# Patient Record
Sex: Male | Born: 1995 | Race: White | Hispanic: No | Marital: Single | Smoking: Current every day smoker
Health system: Southern US, Community
[De-identification: ages and names within clinical notes are randomized; demographics above are authoritative.]

## PROBLEM LIST (undated history)

## (undated) HISTORY — PX: TONSILLECTOMY: SUR1361

---
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
## 2017-08-25 ENCOUNTER — Encounter (HOSPITAL_COMMUNITY): Payer: Self-pay | Admitting: Emergency Medicine

## 2017-08-25 ENCOUNTER — Ambulatory Visit (HOSPITAL_COMMUNITY)
Admission: EM | Admit: 2017-08-25 | Discharge: 2017-08-25 | Disposition: A | Discharge status: Home / Self Care | Payer: Self-pay | Attending: Emergency Medicine | Admitting: Emergency Medicine

## 2017-08-25 DIAGNOSIS — F17210 Nicotine dependence, cigarettes, uncomplicated: Secondary | ICD-10-CM | POA: Insufficient documentation

## 2017-08-25 DIAGNOSIS — J029 Acute pharyngitis, unspecified: Secondary | ICD-10-CM | POA: Insufficient documentation

## 2017-08-25 LAB — POCT RAPID STREP A: STREPTOCOCCUS, GROUP A SCREEN (DIRECT): NEGATIVE

## 2017-08-25 MED ORDER — IBUPROFEN 600 MG PO TABS: 600.0000 mg | ORAL_TABLET | Freq: Four times a day (QID) | ORAL | 0 refills | Status: DC | PRN

## 2017-08-25 MED ORDER — FLUTICASONE PROPIONATE 50 MCG/ACT NA SUSP: 2.0000 | Freq: Every day | NASAL | 0 refills | Status: DC

## 2017-08-25 NOTE — ED Provider Notes (Signed)
HPI  SUBJECTIVE:  James Castaneda is a 22 y.o. male who presents with 3 days of sore throat, postnasal drip, sinus pain and pressure.  Report body aches, cough, raspy voice.  He denies rhinorrhea, fevers.  States that his allergies are bothering him.  Denies headache, abdominal pain, rash, difficulty breathing, neck stiffness, trismus, drooling, GERD symptoms.  He was on antibiotics 3 weeks ago for cellulitis.  He tried ibuprofen 800 mg without improvement of symptoms.  Symptoms better with hot liquids, worse with swallowing.  No antipyretic in the past 6 to 8 hours.  No contacts with strep or URI symptoms.  He has a past medical history of recurrent strep and is status post tonsillectomy.  No history of GERD, diabetes, mono.  PMD: Kathryne Sharper.   History reviewed. No pertinent past medical history.  Past Surgical History:  Procedure Laterality Date  . LAPAROSCOPIC APPENDECTOMY N/A 03/04/2016   Procedure: APPENDECTOMY LAPAROSCOPIC;  Surgeon: Darnell Level, MD;  Location: [facility];  Service: General;  Laterality: N/A;    History reviewed. No pertinent family history.  Social History   Tobacco Use  . Smoking status: Current Every Day Smoker    Packs/day: 0.50    Types: Cigarettes  . Smokeless tobacco: Never Used  Substance Use Topics  . Alcohol use: No  . Drug use: No    No current facility-administered medications for this encounter.   Current Outpatient Medications:  .  fluticasone (FLONASE) 50 MCG/ACT nasal spray, Place 2 sprays into both nostrils daily., Disp: 16 g, Rfl: 0 .  ibuprofen (ADVIL,MOTRIN) 600 MG tablet, Take 1 tablet (600 mg total) by mouth every 6 (six) hours as needed., Disp: 30 tablet, Rfl: 0  No Known Allergies   ROS  As noted in HPI.   Physical Exam  BP 121/65 (BP Location: Right Arm)   Pulse 60   Temp 98.2 F (36.8 C) (Oral)   Resp 18   SpO2 100%   Constitutional: Well developed, well nourished, no acute distress Eyes:  EOMI, conjunctiva normal  bilaterally HENT: Normocephalic, atraumatic,mucus membranes moist.  Positive mucoid nasal congestion.  Mild maxillary sinus tenderness.  Tonsils surgically absent.  Positive cobblestoning with postnasal drip. Neck: No cervical lymphadenopathy Respiratory: Normal inspiratory effort Cardiovascular: Normal rate no murmurs GI: nondistended, no splenomegaly skin: No rash, skin intact Musculoskeletal: no deformities Neurologic: Alert & oriented x 3, no focal neuro deficits Psychiatric: Speech and behavior appropriate   ED Course   Medications - No data to display  Orders Placed This Encounter  Procedures  . Culture, group A strep    Standing Status:   Standing    Number of Occurrences:   1  . POCT rapid strep A [facility])    Standing Status:   Standing    Number of Occurrences:   1    Results for orders placed or performed during the hospital encounter of 08/25/17 (from the past 24 hour(s))  POCT rapid strep A [facility])     Status: None   Collection Time: 08/25/17 10:48 AM  Result Value Ref Range   Streptococcus, Group A Screen (Direct) NEGATIVE NEGATIVE   No results found.  ED Clinical Impression  Sore throat   ED Assessment/Plan  Rapid strep negative.  Suspect this is coming from his allergies since he states that they are bothering him.  Home with Flonase, saline nasal irrigation, Benadryl/Maalox mixture, and antihistamine/decongestant combination of his choice such as Claritin-D, Allegra-D, Zyrtec-D, ibuprofen 600 mg to take  with 1 g of Tylenol 3 or 4 times a day.  There are no clear indications for antibiotics at this time.  Follow-up with his PMD as needed.  Discussed labs,  MDM, treatment plan, and plan for follow-up with patient.. patient agrees with plan.   Meds ordered this encounter  Medications  . fluticasone (FLONASE) 50 MCG/ACT nasal spray    Sig: Place 2 sprays into both nostrils daily.    Dispense:  16 g    Refill:  0  . ibuprofen  (ADVIL,MOTRIN) 600 MG tablet    Sig: Take 1 tablet (600 mg total) by mouth every 6 (six) hours as needed.    Dispense:  30 tablet    Refill:  0    *This clinic note was created usiScientist, clinical (histocompatibility and immunogenetics)ftware. Therefore, there may be occasional mistakes despite careful proofreading.   ?    Domenick Gong, MD 08/25/17 1256

## 2017-08-25 NOTE — ED Triage Notes (Signed)
Pt here for URI sx and sore throat 

## 2017-08-25 NOTE — Discharge Instructions (Addendum)
your rapid strep was negative today, so we have sent off a throat culture.  We will contact you and call in the appropriate antibiotics if your culture comes back positive for an infection requiring antibiotic treatment.  Give [place] a working phone number.   1 gram of Tylenol and 600 mg ibuprofen together 3-4 times a day as needed for pain.  Make sure you drink plenty of extra fluids.  Some people find salt water gargles and  Traditional Medicinal's "Throat Coat" tea helpful. Take 5 mL of liquid Benadryl and 5 mL of Maalox. Mix it together, and then hold it in your mouth for as long as you can and then swallow. You may do this 4 times a day.    Home with Flonase, saline nasal irrigation with distilled water and a Lloyd Huger med sinus rinse, and antihistamine/decongestant combination of your choice such as Claritin-D, Allegra-D, Zyrtec-D.  Go to www.goodrx.com to look up your medications. This will give you a list of where you can find your prescriptions at the most affordable prices. Or ask the pharmacist what the cash price is, or if they have any other discount programs available to help make your medication more affordable. This can be less expensive than what you would pay with insurance.

## 2017-08-27 LAB — CULTURE, GROUP A STREP (THRC)

## 2017-09-07 ENCOUNTER — Ambulatory Visit (HOSPITAL_COMMUNITY)
Admission: EM | Admit: 2017-09-07 | Discharge: 2017-09-07 | Disposition: A | Discharge status: Home / Self Care | Payer: Self-pay | Attending: Family Medicine | Admitting: Family Medicine

## 2017-09-07 ENCOUNTER — Encounter (HOSPITAL_COMMUNITY): Payer: Self-pay | Admitting: Emergency Medicine

## 2017-09-07 ENCOUNTER — Ambulatory Visit (INDEPENDENT_AMBULATORY_CARE_PROVIDER_SITE_OTHER): Payer: Self-pay

## 2017-09-07 ENCOUNTER — Other Ambulatory Visit: Payer: Self-pay

## 2017-09-07 DIAGNOSIS — S51851A Open bite of right forearm, initial encounter: Secondary | ICD-10-CM

## 2017-09-07 DIAGNOSIS — W540XXA Bitten by dog, initial encounter: Secondary | ICD-10-CM

## 2017-09-07 DIAGNOSIS — M79644 Pain in right finger(s): Secondary | ICD-10-CM

## 2017-09-07 DIAGNOSIS — Z23 Encounter for immunization: Secondary | ICD-10-CM

## 2017-09-07 MED ORDER — TETANUS-DIPHTH-ACELL PERTUSSIS 5-2.5-18.5 LF-MCG/0.5 IM SUSP
INTRAMUSCULAR | Status: AC
  Filled 2017-09-07: qty 0.5

## 2017-09-07 MED ORDER — AMOXICILLIN-POT CLAVULANATE 875-125 MG PO TABS: 1.0000 | ORAL_TABLET | Freq: Two times a day (BID) | ORAL | 0 refills | Status: AC

## 2017-09-07 MED ORDER — TETANUS-DIPHTH-ACELL PERTUSSIS 5-2.5-18.5 LF-MCG/0.5 IM SUSP
0.5000 mL | Freq: Once | INTRAMUSCULAR | Status: AC
  Administered 2017-09-07: 0.5 mL via INTRAMUSCULAR

## 2017-09-07 NOTE — Discharge Instructions (Addendum)
Your Xray is normal today. Cleanse wound with soap and water daily, keep covered to keep clean.  Complete course of antibiotics.   Ice, elevation, ibuprofen for pain control, may have some bruising noted  If develop increased pain, swelling, redness, drainage please return to be seen.

## 2017-09-07 NOTE — ED Triage Notes (Addendum)
The patient presented to the [facility] with a complaint of a dog bite to his right forearm that occurred today while working at the [employer]. The patient reported that the animal is current on its rabies vaccinations. The patient's wound was wrapped with gauze upon arrival at triage.

## 2017-09-07 NOTE — ED Provider Notes (Signed)
[facility]    CSN: [id number] Arrival date & time: 09/07/17  1023     History   Chief Complaint Chief Complaint  Patient presents with  . Animal Bite    HPI James Castaneda is a 22 y.o. male.   James Castaneda presents with complaints of dog bite to right forearm which occurred at approximately 10am today. He was working at the [employer] for [employer]. Two dogs started fighting and he tried to separate them, causing a dog to pull back and bite his arm. He had bleeding. Wounds were cleansed and wrapped prior to arrival. The dog is known to but updated on rabies vaccine. States has pain with movement to his middle finger. Without numbness or tingling to hand or fingers. Unknown last tetanus. Without contributing medical history.    ROS per HPI.       History reviewed. No pertinent past medical history.  Patient Active Problem List   Diagnosis Date Noted  . Appendicitis, acute 03/04/2016  . Acute appendicitis 03/04/2016    Past Surgical History:  Procedure Laterality Date  . LAPAROSCOPIC APPENDECTOMY N/A 03/04/2016   Procedure: APPENDECTOMY LAPAROSCOPIC;  Surgeon: Darnell Level, MD;  Location: [facility];  Service: General;  Laterality: N/A;  . TONSILLECTOMY         Home Medications    Prior to Admission medications   Medication Sig Start Date End Date Taking? Authorizing Provider  amoxicillin-clavulanate (AUGMENTIN) 875-125 MG tablet Take 1 tablet by mouth every 12 (twelve) hours for 10 days. 09/07/17 09/17/17  Georgetta Haber, NP    Family History History reviewed. No pertinent family history.  Social History Social History   Tobacco Use  . Smoking status: Current Every Day Smoker    Packs/day: 0.50    Types: Cigarettes  . Smokeless tobacco: Never Used  Substance Use Topics  . Alcohol use: No  . Drug use: No     Allergies   Patient has no known allergies.   Review of Systems Review of Systems   Physical Exam Triage Vital Signs ED  Triage Vitals [09/07/17 1100]  Enc Vitals Group     BP 130/78     Pulse Rate 80     Resp 18     Temp 98.5 F (36.9 C)     Temp Source Oral     SpO2 100 %     Weight      Height      Head Circumference      Peak Flow      Pain Score 10     Pain Loc      Pain Edu?      Excl. in GC?    No data found.  Updated Vital Signs BP 130/78 (BP Location: Left Arm)   Pulse 80   Temp 98.5 F (36.9 C) (Oral)   Resp 18   SpO2 100%   Visual Acuity Right Eye Distance:   Left Eye Distance:   Bilateral Distance:    Right Eye Near:   Left Eye Near:    Bilateral Near:     Physical Exam  Constitutional: He is oriented to person, place, and time. He appears well-developed and well-nourished.  Cardiovascular: Normal rate and regular rhythm.  Pulmonary/Chest: Effort normal and breath sounds normal.  Musculoskeletal:       Right forearm: He exhibits tenderness and swelling. He exhibits no edema and no deformity.  Approximately 5 puncture wounds to right forearm, 1 with active bleeding once dressing  removed; to dorsal and ventral forearm; mild tenderness and swelling to dorsal forearm puncture; strong radial pulse; full sensation and ROM to fingers noted; full ROM to wrist and elbow  Neurological: He is alert and oriented to person, place, and time.  Skin: Skin is warm and dry.     UC Treatments / Results  Labs (all labs ordered are listed, but only abnormal results are displayed) Labs Reviewed - No data to display  EKG None  Radiology No results found.  Procedures Procedures (including critical care time)  Medications Ordered in UC Medications  Tdap (BOOSTRIX) injection 0.5 mL (has no administration in time range)    Initial Impression / Assessment and Plan / UC Course  I have reviewed the triage vital signs and the nursing notes.  Pertinent labs & imaging results that were available during my care of the patient were reviewed by me and considered in my medical decision  making (see chart for details).     tdap updated. Xray obtained to evaluate for fb and underlying structure involvement. Xray withotu acute findings. Wounds cleansed and dressed. augmentin initiated at this time for prophylaxis. Dog is up to date with rabies vac. Return precautions provided. Patient verbalized understanding and agreeable to plan.     Final Clinical Impressions(s) / UC Diagnoses   Final diagnoses:  Dog bite, initial encounter     Discharge Instructions     Cleanse wound with soap and water daily, keep covered to keep clean.  Complete course of antibiotics.   Ice, elevation, ibuprofen for pain control, may have some bruising noted  If develop increased pain, swelling, redness, drainage please return to be seen.    ED Prescriptions    Medication Sig Dispense Auth. Provider   amoxicillin-clavulanate (AUGMENTIN) 875-125 MG tablet Take 1 tablet by mouth every 12 (twelve) hours for 10 days. 20 tablet Georgetta Haber, NP     Controlled Substance Prescriptions [place] Controlled Substance Registry consulted? Not Applicable   Georgetta Haber, NP 09/07/17 1209

## 2017-09-09 ENCOUNTER — Other Ambulatory Visit: Payer: Self-pay

## 2017-09-09 ENCOUNTER — Encounter (HOSPITAL_COMMUNITY): Payer: Self-pay | Admitting: Emergency Medicine

## 2017-09-09 ENCOUNTER — Ambulatory Visit (HOSPITAL_COMMUNITY)
Admission: EM | Admit: 2017-09-09 | Discharge: 2017-09-09 | Disposition: A | Discharge status: Home / Self Care | Payer: Self-pay | Attending: Family Medicine | Admitting: Family Medicine

## 2017-09-09 ENCOUNTER — Ambulatory Visit (INDEPENDENT_AMBULATORY_CARE_PROVIDER_SITE_OTHER): Payer: Self-pay

## 2017-09-09 DIAGNOSIS — M79641 Pain in right hand: Secondary | ICD-10-CM

## 2017-09-09 DIAGNOSIS — W540XXD Bitten by dog, subsequent encounter: Secondary | ICD-10-CM

## 2017-09-09 DIAGNOSIS — S51851D Open bite of right forearm, subsequent encounter: Secondary | ICD-10-CM

## 2017-09-09 MED ORDER — IBUPROFEN 800 MG PO TABS: 800.0000 mg | ORAL_TABLET | Freq: Three times a day (TID) | ORAL | 0 refills | Status: AC

## 2017-09-09 NOTE — ED Provider Notes (Signed)
[facility]    CSN: [id number] Arrival date & time: 09/09/17  1337     History   Chief Complaint Chief Complaint  Patient presents with  . Hand Pain    HPI James Castaneda is a 22 y.o. male no extremity past medical history presenting today for evaluation of right hand pain.  Patient was bitten by a dog at the animal shelter will working as part of the [employer].  This happened on Monday, patient was seen and evaluated here on the same day, forearm x-rays were obtained which were negative.  Patient was started on Augmentin which he began yesterday.  He has had 3 doses of this.  He presents again today as he has felt weakness with grip strength as well as some mild numbness and tingling in his second and fifth fingers.  HPI  History reviewed. No pertinent past medical history.  Patient Active Problem List   Diagnosis Date Noted  . Appendicitis, acute 03/04/2016  . Acute appendicitis 03/04/2016    Past Surgical History:  Procedure Laterality Date  . LAPAROSCOPIC APPENDECTOMY N/A 03/04/2016   Procedure: APPENDECTOMY LAPAROSCOPIC;  Surgeon: Darnell Level, MD;  Location: [facility];  Service: General;  Laterality: N/A;  . TONSILLECTOMY         Home Medications    Prior to Admission medications   Medication Sig Start Date End Date Taking? Authorizing Provider  amoxicillin-clavulanate (AUGMENTIN) 875-125 MG tablet Take 1 tablet by mouth every 12 (twelve) hours for 10 days. 09/07/17 09/17/17  Georgetta Haber, NP  ibuprofen (ADVIL,MOTRIN) 800 MG tablet Take 1 tablet (800 mg total) by mouth 3 (three) times daily. 09/09/17   Wieters, Junius Creamer, PA-C    Family History Family History  Problem Relation Age of Onset  . Hypertension Mother     Social History Social History   Tobacco Use  . Smoking status: Current Every Day Smoker    Packs/day: 0.50    Types: Cigarettes  . Smokeless tobacco: Never Used  Substance Use Topics  . Alcohol use: No  . Drug use: No      Allergies   Patient has no known allergies.   Review of Systems Review of Systems  Constitutional: Negative for fatigue and fever.  Eyes: Negative for redness, itching and visual disturbance.  Respiratory: Negative for shortness of breath.   Cardiovascular: Negative for chest pain and leg swelling.  Gastrointestinal: Negative for nausea and vomiting.  Musculoskeletal: Positive for arthralgias, joint swelling and myalgias.  Skin: Positive for color change and wound. Negative for rash.  Neurological: Negative for dizziness, syncope, weakness, light-headedness and headaches.     Physical Exam Triage Vital Signs ED Triage Vitals  Enc Vitals Group     BP 09/09/17 1405 122/70     Pulse Rate 09/09/17 1405 95     Resp 09/09/17 1405 18     Temp 09/09/17 1405 97.6 F (36.4 C)     Temp Source 09/09/17 1405 Oral     SpO2 09/09/17 1405 99 %     Weight --      Height --      Head Circumference --      Peak Flow --      Pain Score 09/09/17 1401 8     Pain Loc --      Pain Edu? --      Excl. in GC? --    No data found.  Updated Vital Signs BP 122/70 (BP Location: Left Arm)  Pulse 95   Temp 97.6 F (36.4 C) (Oral)   Resp 18   SpO2 99%   Visual Acuity Right Eye Distance:   Left Eye Distance:   Bilateral Distance:    Right Eye Near:   Left Eye Near:    Bilateral Near:     Physical Exam  Constitutional: He appears well-developed and well-nourished.  HENT:  Head: Normocephalic and atraumatic.  Eyes: Conjunctivae are normal.  Neck: Neck supple.  Cardiovascular: Normal rate and regular rhythm.  No murmur heard. Pulmonary/Chest: Effort normal and breath sounds normal. No respiratory distress.  Abdominal: Soft. There is no tenderness.  Musculoskeletal: He exhibits no edema.  Moderate swelling over forearm, patient with 4/5 grip strength on right side compared to left, able to fully abduct all fingers except for fifth, states that this is normal, patient able to  abduct fingers, 4/5 strength at wrist with flexion and extension compared to left.  Neurological: He is alert.  Skin: Skin is warm and dry.  Multiple puncture wounds to right forearm, appear to be healing well, no surrounding erythema  Psychiatric: He has a normal mood and affect.  Nursing note and vitals reviewed.    UC Treatments / Results  Labs (all labs ordered are listed, but only abnormal results are displayed) Labs Reviewed - No data to display  EKG None  Radiology No results found.  Procedures Procedures (including critical care time)  Medications Ordered in UC Medications - No data to display  Initial Impression / Assessment and Plan / UC Course  I have reviewed the triage vital signs and the nursing notes.  Pertinent labs & imaging results that were available during my care of the patient were reviewed by me and considered in my medical decision making (see chart for details).     X-ray negative.  Patient likely needs PT or OT to work on hand movement, will have follow-up with Ortho/hand.  Will provide ibuprofen for pain.  Continue to complete course of Augmentin. Discussed strict return precautions. Patient verbalized understanding and is agreeable with plan.  Final Clinical Impressions(s) / UC Diagnoses   Final diagnoses:  Dog bite, subsequent encounter  Right hand pain     Discharge Instructions     Use anti-inflammatories for pain/swelling. You may take up to 800 mg Ibuprofen every 8 hours with food. You may supplement Ibuprofen with Tylenol [phone] mg every 8 hours.   Work on regaining movement in hand  Follow up with orthopedics/hand for further evaluation of hand weakness.  You may need occupation therapy or physical therapy   ED Prescriptions    Medication Sig Dispense Auth. Provider   ibuprofen (ADVIL,MOTRIN) 800 MG tablet Take 1 tablet (800 mg total) by mouth 3 (three) times daily. 30 tablet Wieters, Irrigon C, PA-C     Controlled  Substance Prescriptions [place] Controlled Substance Registry consulted? Not Applicable   Lew Dawes, [place] 09/09/17 1442

## 2017-09-09 NOTE — ED Triage Notes (Addendum)
Patient works at [employer].  Reports he had a dog bite to right arm and was [facility] recently.   Reports having difficulty moving fingers as normal.  Says he cannot make a fist-with attempt feels sharp pain down middle finger and into hand and forearm.  Reports unable to maintain a grip with right hand  Patient is currently a resident at [facility] house

## 2017-09-09 NOTE — Discharge Instructions (Signed)
Use anti-inflammatories for pain/swelling. You may take up to 800 mg Ibuprofen every 8 hours with food. You may supplement Ibuprofen with Tylenol [phone] mg every 8 hours.   Work on regaining movement in hand  Follow up with orthopedics/hand for further evaluation of hand weakness.  You may need occupation therapy or physical therapy

## 2019-06-17 ENCOUNTER — Emergency Department (HOSPITAL_COMMUNITY)
Admission: EM | Admit: 2019-06-17 | Discharge: 2019-06-17 | Disposition: A | Discharge status: Home / Self Care | Payer: Self-pay | Attending: Emergency Medicine | Admitting: Emergency Medicine

## 2019-06-17 ENCOUNTER — Other Ambulatory Visit: Payer: Self-pay

## 2019-06-17 ENCOUNTER — Encounter (HOSPITAL_COMMUNITY): Payer: Self-pay

## 2019-06-17 DIAGNOSIS — F17210 Nicotine dependence, cigarettes, uncomplicated: Secondary | ICD-10-CM | POA: Insufficient documentation

## 2019-06-17 DIAGNOSIS — K029 Dental caries, unspecified: Secondary | ICD-10-CM | POA: Insufficient documentation

## 2019-06-17 DIAGNOSIS — Z79899 Other long term (current) drug therapy: Secondary | ICD-10-CM | POA: Insufficient documentation

## 2019-06-17 DIAGNOSIS — K0381 Cracked tooth: Secondary | ICD-10-CM | POA: Insufficient documentation

## 2019-06-17 DIAGNOSIS — K0889 Other specified disorders of teeth and supporting structures: Secondary | ICD-10-CM

## 2019-06-17 MED ORDER — PENICILLIN V POTASSIUM 500 MG PO TABS: 500.0000 mg | ORAL_TABLET | Freq: Four times a day (QID) | ORAL | 0 refills | Status: AC

## 2019-06-17 MED ORDER — IBUPROFEN 400 MG PO TABS
600.0000 mg | ORAL_TABLET | Freq: Once | ORAL | Status: AC
  Administered 2019-06-17: 11:00:00 600 mg via ORAL
  Filled 2019-06-17: qty 1

## 2019-06-17 NOTE — ED Triage Notes (Signed)
Pt c.o right upper tooth pain since 6am this morning. Chipped tooth noted. States he took tylenol this am

## 2019-06-17 NOTE — Discharge Instructions (Addendum)
You were given a prescription for antibiotics. Please take the antibiotic prescription fully.   You may alternate taking Tylenol and Ibuprofen as needed for pain control. You may take 400-600 mg of ibuprofen every 6 hours and [phone] mg of Tylenol every 6 hours. Do not exceed 4000 mg of Tylenol daily as this can lead to liver damage. Also, make sure to take Ibuprofen with meals as it can cause an upset stomach. Do not take other NSAIDs while taking Ibuprofen such as (Aleve, Naprosyn, Aspirin, Celebrex, etc) and do not take more than the prescribed dose as this can lead to ulcers and bleeding in your GI tract. You may use warm and cold compresses to help with your symptoms.   Please follow-up with a dentist in the next 5 to 7 days for reevaluation.  If you do not have a dentist, resources were provided for dentist in the area in your discharge summary.  Please contact one of the offices that are listed and make an appointment for follow-up.  Please return to the emergency department for any new or worsening symptoms.

## 2019-06-17 NOTE — ED Provider Notes (Signed)
[facility] EMERGENCY DEPARTMENT Provider Note   CSN: [id number] Arrival date & time: 06/17/19  1037     History Chief Complaint  Patient presents with  . Dental Pain    James Castaneda is a 24 y.o. male.  HPI   24 year old male with a history appendicitis, who presents to the emergency department today for evaluation of dental pain.  States he has right upper and right lower dental pain.  He has had pain in the past which is normally relieved by Tylenol however he try taking it this morning when he woke up but symptoms have not improved.  Denies associated fevers or facial swelling.  Denies other associated symptoms.  History reviewed. No pertinent past medical history.  Patient Active Problem List   Diagnosis Date Noted  . Appendicitis, acute 03/04/2016  . Acute appendicitis 03/04/2016    Past Surgical History:  Procedure Laterality Date  . LAPAROSCOPIC APPENDECTOMY N/A 03/04/2016   Procedure: APPENDECTOMY LAPAROSCOPIC;  Surgeon: Darnell Level, MD;  Location: [facility];  Service: General;  Laterality: N/A;  . TONSILLECTOMY         Family History  Problem Relation Age of Onset  . Hypertension Mother     Social History   Tobacco Use  . Smoking status: Current Every Day Smoker    Packs/day: 0.50    Types: Cigarettes  . Smokeless tobacco: Never Used  Substance Use Topics  . Alcohol use: No  . Drug use: No    Home Medications Prior to Admission medications   Medication Sig Start Date End Date Taking? Authorizing Provider  ibuprofen (ADVIL,MOTRIN) 800 MG tablet Take 1 tablet (800 mg total) by mouth 3 (three) times daily. 09/09/17   Wieters, Hallie C, PA-C  penicillin v potassium (VEETID) 500 MG tablet Take 1 tablet (500 mg total) by mouth 4 (four) times daily for 7 days. 06/17/19 06/24/19  James Brownlee S, PA-C    Allergies    Patient has no known allergies.  Review of Systems   Review of Systems  Constitutional: Negative for fever.  HENT:  Positive for dental problem. Negative for facial swelling.     Physical Exam Updated Vital Signs BP 136/87 (BP Location: Right Arm)   Pulse (!) 51   Temp 97.9 F (36.6 C) (Oral)   Resp 16   SpO2 100%   Physical Exam Constitutional:      General: He is not in acute distress.    Appearance: He is well-developed.  HENT:     Mouth/Throat:     Comments: Multiple dental caries, and missing teeth.  Tooth #3 is fractured and tender to percussion.  Tooth #31 is also tender to percussion.  No tenderness to the gumline or evidence of abscess.  No facial swelling or neck swelling.  No submandibular or sublingual swelling. Eyes:     Conjunctiva/sclera: Conjunctivae normal.  Cardiovascular:     Rate and Rhythm: Normal rate and regular rhythm.  Pulmonary:     Effort: Pulmonary effort is normal.     Breath sounds: Normal breath sounds.  Skin:    General: Skin is warm and dry.  Neurological:     Mental Status: He is alert and oriented to person, place, and time.     ED Results / Procedures / Treatments   Labs (all labs ordered are listed, but only abnormal results are displayed) Labs Reviewed - No data to display  EKG None  Radiology No results found.  Procedures Procedures (including critical care  time)  Medications Ordered in ED Medications - No data to display  ED Course  I have reviewed the triage vital signs and the nursing notes.  Pertinent labs & imaging results that were available during my care of the patient were reviewed by me and considered in my medical decision making (see chart for details).    MDM Rules/Calculators/A&P                      Patient with toothache.  No gross abscess.  Exam unconcerning for Ludwig'Castaneda angina or spread of infection.  Will treat with penicillin and pain medicine.  Urged patient to follow-up with dentist.     Final Clinical Impression(Castaneda) / ED Diagnoses Final diagnoses:  Pain, dental    Rx / DC Orders ED Discharge Orders          Ordered    penicillin v potassium (VEETID) 500 MG tablet  4 times daily     06/17/19 [address] James Lingenfelter S, PA-C 06/17/19 1055    Quintella Reichert, MD 06/17/19 1554

## 2020-07-12 ENCOUNTER — Encounter (HOSPITAL_COMMUNITY): Payer: Self-pay

## 2020-07-12 ENCOUNTER — Emergency Department (HOSPITAL_COMMUNITY): Payer: No Typology Code available for payment source

## 2020-07-12 ENCOUNTER — Emergency Department (HOSPITAL_COMMUNITY)
Admission: EM | Admit: 2020-07-12 | Discharge: 2020-07-12 | Disposition: A | Discharge status: Home / Self Care | Payer: No Typology Code available for payment source | Attending: Emergency Medicine | Admitting: Emergency Medicine

## 2020-07-12 DIAGNOSIS — R072 Precordial pain: Secondary | ICD-10-CM | POA: Insufficient documentation

## 2020-07-12 DIAGNOSIS — Y92410 Unspecified street and highway as the place of occurrence of the external cause: Secondary | ICD-10-CM | POA: Insufficient documentation

## 2020-07-12 DIAGNOSIS — M542 Cervicalgia: Secondary | ICD-10-CM | POA: Insufficient documentation

## 2020-07-12 DIAGNOSIS — Z5321 Procedure and treatment not carried out due to patient leaving prior to being seen by health care provider: Secondary | ICD-10-CM | POA: Diagnosis not present

## 2020-07-12 DIAGNOSIS — M25569 Pain in unspecified knee: Secondary | ICD-10-CM | POA: Insufficient documentation

## 2020-07-12 MED ORDER — IBUPROFEN 400 MG PO TABS
400.0000 mg | ORAL_TABLET | Freq: Once | ORAL | Status: DC
Start: 2020-07-12 — End: 2020-07-13

## 2020-07-12 MED ORDER — OXYCODONE-ACETAMINOPHEN 5-325 MG PO TABS
1.0000 | ORAL_TABLET | Freq: Once | ORAL | Status: AC
  Administered 2020-07-12: 1 via ORAL
  Filled 2020-07-12: qty 1
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
## 2020-07-12 NOTE — ED Notes (Signed)
Pt got up and used the urinal without distress or pain.

## 2020-10-22 ENCOUNTER — Encounter (HOSPITAL_BASED_OUTPATIENT_CLINIC_OR_DEPARTMENT_OTHER): Payer: Self-pay | Admitting: Emergency Medicine

## 2020-10-22 ENCOUNTER — Emergency Department (HOSPITAL_BASED_OUTPATIENT_CLINIC_OR_DEPARTMENT_OTHER)
Admission: EM | Admit: 2020-10-22 | Discharge: 2020-10-22 | Disposition: A | Discharge status: Home / Self Care | Payer: Self-pay | Attending: Emergency Medicine | Admitting: Emergency Medicine

## 2020-10-22 ENCOUNTER — Other Ambulatory Visit: Payer: Self-pay

## 2020-10-22 DIAGNOSIS — F17210 Nicotine dependence, cigarettes, uncomplicated: Secondary | ICD-10-CM | POA: Insufficient documentation

## 2020-10-22 DIAGNOSIS — R202 Paresthesia of skin: Secondary | ICD-10-CM | POA: Insufficient documentation

## 2020-10-22 DIAGNOSIS — M546 Pain in thoracic spine: Secondary | ICD-10-CM | POA: Insufficient documentation

## 2020-10-22 MED ORDER — DICLOFENAC SODIUM 1 % EX GEL: 2.0000 g | Freq: Four times a day (QID) | CUTANEOUS | 0 refills | Status: AC

## 2020-10-22 MED ORDER — METHOCARBAMOL 500 MG PO TABS: 500.0000 mg | ORAL_TABLET | Freq: Two times a day (BID) | ORAL | 0 refills | Status: AC | PRN
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
## 2020-10-22 NOTE — Discharge Instructions (Addendum)
I am prescribing you a strong muscle relaxer called Robaxin.  You can take this up to 2 times a day for management of your pain.  This medication can be sedating so do not mix it with alcohol.  Do not drive a car after taking it.  I am also prescribing you Voltaren gel.  This is an anti-inflammatory cream you can apply to the region of your back and shoulder that is hurting.  Please follow-up with Dr. Jordan Likes.  He has a sports medicine doctor that is located in the same building as this emergency department.  I would recommend following up with him to be reevaluated.  You can always come back to the emergency department if your symptoms worsen.  It was a pleasure to meet you.

## 2021-04-23 ENCOUNTER — Emergency Department (HOSPITAL_BASED_OUTPATIENT_CLINIC_OR_DEPARTMENT_OTHER)
Admission: EM | Admit: 2021-04-23 | Discharge: 2021-04-23 | Disposition: A | Discharge status: Home / Self Care | Payer: Self-pay | Attending: Student | Admitting: Student

## 2021-04-23 ENCOUNTER — Encounter (HOSPITAL_BASED_OUTPATIENT_CLINIC_OR_DEPARTMENT_OTHER): Payer: Self-pay | Admitting: *Deleted

## 2021-04-23 ENCOUNTER — Other Ambulatory Visit: Payer: Self-pay

## 2021-04-23 DIAGNOSIS — F17210 Nicotine dependence, cigarettes, uncomplicated: Secondary | ICD-10-CM | POA: Insufficient documentation

## 2021-04-23 DIAGNOSIS — Z79899 Other long term (current) drug therapy: Secondary | ICD-10-CM | POA: Insufficient documentation

## 2021-04-23 DIAGNOSIS — J069 Acute upper respiratory infection, unspecified: Secondary | ICD-10-CM | POA: Insufficient documentation

## 2021-04-23 DIAGNOSIS — Z20822 Contact with and (suspected) exposure to covid-19: Secondary | ICD-10-CM | POA: Insufficient documentation

## 2021-04-23 LAB — RESP PANEL BY RT-PCR (FLU A&B, COVID) ARPGX2
Influenza A by PCR: NEGATIVE
Influenza B by PCR: NEGATIVE
SARS Coronavirus 2 by RT PCR: NEGATIVE

## 2021-04-23 NOTE — ED Triage Notes (Signed)
Body aches, chest congestion cough and runny nose since last night.

## 2021-04-23 NOTE — ED Provider Notes (Signed)
[facility] EMERGENCY DEPARTMENT Provider Note   CSN: [id number] Arrival date & time: 04/23/21  1837     History Chief Complaint  Patient presents with   URI    James Castaneda is a 25 y.o. male resenting with URI symptoms that began yesterday.  Patient reports that he had chills, sweats, body aches and congestion.  Today he feels as though he is no longer febrile but is bothered by the head congestion and body aches.  He has been using Sudafed and aspirin over-the-counter.  No known sick contacts.  No abdominal pain, nausea or vomiting.  No difficulty breathing or chest pain.  History reviewed. No pertinent past medical history.  Patient Active Problem List   Diagnosis Date Noted   Appendicitis, acute 03/04/2016   Acute appendicitis 03/04/2016    Past Surgical History:  Procedure Laterality Date   LAPAROSCOPIC APPENDECTOMY N/A 03/04/2016   Procedure: APPENDECTOMY LAPAROSCOPIC;  Surgeon: Darnell Level, MD;  Location: [facility];  Service: General;  Laterality: N/A;   TONSILLECTOMY         Family History  Problem Relation Age of Onset   Hypertension Mother     Social History   Tobacco Use   Smoking status: Every Day    Packs/day: 0.50    Types: Cigarettes   Smokeless tobacco: Never  Vaping Use   Vaping Use: Never used  Substance Use Topics   Alcohol use: No   Drug use: No    Home Medications Prior to Admission medications   Medication Sig Start Date End Date Taking? Authorizing Provider  diclofenac Sodium (VOLTAREN) 1 % GEL Apply 2 g topically 4 (four) times daily. 10/22/20   Placido Sou, PA-C  ibuprofen (ADVIL,MOTRIN) 800 MG tablet Take 1 tablet (800 mg total) by mouth 3 (three) times daily. 09/09/17   Wieters, Hallie C, PA-C  methocarbamol (ROBAXIN) 500 MG tablet Take 1 tablet (500 mg total) by mouth 2 (two) times daily as needed for muscle spasms. 10/22/20   Placido Sou, PA-C    Allergies    Patient has no known allergies.  Review of Systems    Review of Systems  Constitutional:  Positive for chills.  HENT:  Positive for congestion.   Respiratory:  Positive for stridor. Negative for cough and shortness of breath.   Cardiovascular:  Negative for chest pain and palpitations.  Gastrointestinal:  Negative for diarrhea and vomiting.  Musculoskeletal:  Positive for myalgias.  Neurological:  Positive for headaches.   Physical Exam Updated Vital Signs BP 107/79 (BP Location: Right Arm)    Pulse 81    Temp 98.1 F (36.7 C) (Oral)    Resp 18    Ht 6\' 1"  (1.854 m)    Wt 77.1 kg    SpO2 99%    BMI 22.43 kg/m   Physical Exam Vitals and nursing note reviewed.  Constitutional:      General: He is not in acute distress.    Appearance: Normal appearance. He is not ill-appearing or toxic-appearing.  HENT:     Head: Normocephalic and atraumatic.     Mouth/Throat:     Mouth: Mucous membranes are dry.     Pharynx: Oropharynx is clear. No posterior oropharyngeal erythema.  Eyes:     General: No scleral icterus.    Conjunctiva/sclera: Conjunctivae normal.  Cardiovascular:     Rate and Rhythm: Normal rate and regular rhythm.     Heart sounds: No murmur heard. Pulmonary:     Effort: Pulmonary effort is  normal. No respiratory distress.     Breath sounds: No wheezing or rales.  Abdominal:     Tenderness: There is no abdominal tenderness.  Skin:    Findings: No rash.  Neurological:     Mental Status: He is alert.  Psychiatric:        Mood and Affect: Mood normal.        Behavior: Behavior normal.    ED Results / Procedures / Treatments   Labs (all labs ordered are listed, but only abnormal results are displayed) Labs Reviewed  RESP PANEL BY RT-PCR (FLU A&B, COVID) [id number]    EKG None  Radiology No results found.  Procedures Procedures   Medications Ordered in ED Medications - No data to display  ED Course  I have reviewed the triage vital signs and the nursing notes.  Pertinent labs & imaging results that were  available during my care of the patient were reviewed by me and considered in my medical decision making (see chart for details).    MDM Rules/Calculators/A&P 25 year old male presenting with URI symptoms.  All testing negative.  Patient is stable, asymptomatic outside of body aches and congestion.  Proper over-the-counter care has been discussed.  Will be discharged with work note as requested. Final Clinical Impression(s) / ED Diagnoses Final diagnoses:  Viral URI    Rx / DC Orders Results and diagnoses were explained to the patient. Return precautions discussed in full. Patient had no additional questions and expressed complete understanding.     Woodroe Chen 04/23/21 2118    Glendora Score, MD 04/23/21 [phone]

## 2021-04-23 NOTE — ED Notes (Signed)
Patient discharged to home.  All discharge instructions reviewed.  Patient verbalized understanding via teachback method.  VS WDL.  Respirations even and unlabored.  Ambulatory out of ED.   °

## 2021-07-15 ENCOUNTER — Emergency Department (HOSPITAL_COMMUNITY)
Admission: EM | Admit: 2021-07-15 | Discharge: 2021-07-16 | Disposition: A | Discharge status: Home / Self Care | Payer: 59 | Attending: Emergency Medicine | Admitting: Emergency Medicine

## 2021-07-15 ENCOUNTER — Other Ambulatory Visit: Payer: Self-pay

## 2021-07-15 ENCOUNTER — Encounter (HOSPITAL_COMMUNITY): Payer: Self-pay

## 2021-07-15 DIAGNOSIS — F17210 Nicotine dependence, cigarettes, uncomplicated: Secondary | ICD-10-CM | POA: Diagnosis not present

## 2021-07-15 DIAGNOSIS — R197 Diarrhea, unspecified: Secondary | ICD-10-CM | POA: Insufficient documentation

## 2021-07-15 DIAGNOSIS — E860 Dehydration: Secondary | ICD-10-CM | POA: Diagnosis present

## 2021-07-15 DIAGNOSIS — R112 Nausea with vomiting, unspecified: Secondary | ICD-10-CM | POA: Diagnosis not present

## 2021-07-15 LAB — COMPREHENSIVE METABOLIC PANEL
ALT: 23 U/L (ref 0–44)
AST: 21 U/L (ref 15–41)
Albumin: 4.6 g/dL (ref 3.5–5.0)
Alkaline Phosphatase: 60 U/L (ref 38–126)
Anion gap: 12 (ref 5–15)
BUN: 20 mg/dL (ref 6–20)
CO2: 25 mmol/L (ref 22–32)
Calcium: 9.2 mg/dL (ref 8.9–10.3)
Chloride: 99 mmol/L (ref 98–111)
Creatinine, Ser: 0.8 mg/dL (ref 0.61–1.24)
GFR, Estimated: >60 mL/min (ref >60)
Glucose, Bld: 152 mg/dL — ABNORMAL HIGH (ref 70–99)
Potassium: 3.6 mmol/L (ref 3.5–5.1)
Sodium: 136 mmol/L (ref 135–145)
Total Bilirubin: 1.1 mg/dL (ref 0.3–1.2)
Total Protein: 7.9 g/dL (ref 6.5–8.1)

## 2021-07-15 LAB — LIPASE, BLOOD: Lipase: 30 U/L (ref 11–51)

## 2021-07-15 LAB — CBC
HCT: 45.8 % (ref 39.0–52.0)
Hemoglobin: 15.8 g/dL (ref 13.0–17.0)
MCH: 30.6 pg (ref 26.0–34.0)
MCHC: 34.5 g/dL (ref 30.0–36.0)
MCV: 88.8 fL (ref 80.0–100.0)
Platelets: 202 10*3/uL (ref 150–400)
RBC: 5.16 MIL/uL (ref 4.22–5.81)
RDW: 12.1 % (ref 11.5–15.5)
WBC: 10.6 10*3/uL — ABNORMAL HIGH (ref 4.0–10.5)
nRBC: 0 % (ref 0.0–0.2)

## 2021-07-15 NOTE — ED Triage Notes (Signed)
Ambulatory with c/o dehydration. State's he's had N/V/D since 8 am.  ?

## 2021-07-16 MED ORDER — SODIUM CHLORIDE 0.9 % IV BOLUS
1000.0000 mL | Freq: Once | INTRAVENOUS | Status: AC
  Administered 2021-07-16: 1000 mL via INTRAVENOUS

## 2021-07-16 MED ORDER — ONDANSETRON 4 MG PO TBDP: 4.0000 mg | ORAL_TABLET | Freq: Three times a day (TID) | ORAL | 0 refills | Status: AC | PRN

## 2021-07-16 MED ORDER — ONDANSETRON HCL 4 MG/2ML IJ SOLN
4.0000 mg | Freq: Once | INTRAMUSCULAR | Status: AC
  Administered 2021-07-16: 4 mg via INTRAVENOUS
  Filled 2021-07-16: qty 2

## 2021-07-16 NOTE — Discharge Instructions (Addendum)
You were evaluated in the Emergency Department and after careful evaluation, we did not find any emergent condition requiring admission or further testing in the hospital. ? ?Your exam/testing today is overall reassuring.  Symptoms likely due to a viral GI illness.  Use the Zofran medication as needed for nausea, drink plenty fluids, get plenty of rest. ? ?Please return to the Emergency Department if you experience any worsening of your condition.   Thank you for allowing [place] to be a part of your care. ?

## 2021-07-16 NOTE — ED Provider Notes (Signed)
?[place] DEPT ?[facility] Emergency Department ?Provider Note ?MRN:  [id number]  ?Arrival date & time: 07/16/21    ? ?Chief Complaint   ?Dehydration ?  ?History of Present Illness   ?James Castaneda is a 26 y.o. year-old male with a history of appendectomy presenting to the ED with chief complaint of dehydration. ? ?Patient had a long weekend at work and felt a bit dehydrated.  This morning began having persistent nausea vomiting and diarrhea.  No abdominal pain, no chest pain, no fever.  Trouble eating or drinking anything today. ? ?Review of Systems  ?A thorough review of systems was obtained and all systems are negative except as noted in the HPI and PMH.  ? ?Patient's Health History   ?History reviewed. No pertinent past medical history.  ?Past Surgical History:  ?Procedure Laterality Date  ? LAPAROSCOPIC APPENDECTOMY N/A 03/04/2016  ? Procedure: APPENDECTOMY LAPAROSCOPIC;  Surgeon: Armandina Gemma, MD;  Location: [facility];  Service: General;  Laterality: N/A;  ? TONSILLECTOMY    ?  ?Family History  ?Problem Relation Age of Onset  ? Hypertension Mother   ?  ?Social History  ? ?Socioeconomic History  ? Marital status: Single  ?  Spouse name: Not on file  ? Number of children: Not on file  ? Years of education: Not on file  ? Highest education level: Not on file  ?Occupational History  ? Not on file  ?Tobacco Use  ? Smoking status: Every Day  ?  Packs/day: 0.50  ?  Types: Cigarettes  ? Smokeless tobacco: Never  ?Vaping Use  ? Vaping Use: Never used  ?Substance and Sexual Activity  ? Alcohol use: No  ? Drug use: No  ? Sexual activity: Not on file  ?Other Topics Concern  ? Not on file  ?Social History Narrative  ? Not on file  ? ?Social Determinants of Health  ? ?Financial Resource Strain: Not on file  ?Food Insecurity: Not on file  ?Transportation Needs: Not on file  ?Physical Activity: Not on file  ?Stress: Not on file  ?Social Connections: Not on file  ?Intimate Partner Violence: Not on file  ?  ? ?Physical  Exam  ? ?Vitals:  ? 07/15/21 2239  ?BP: 129/81  ?Pulse: (!) 109  ?Resp: 18  ?Temp: 98.7 ?F (37.1 ?C)  ?SpO2: 95%  ?  ?CONSTITUTIONAL: Well-appearing, NAD ?NEURO/PSYCH:  Alert and oriented x 3, no focal deficits ?EYES:  eyes equal and reactive ?ENT/NECK:  no LAD, no JVD ?CARDIO: Regular rate, well-perfused, normal S1 and S2 ?PULM:  CTAB no wheezing or rhonchi ?GI/GU:  non-distended, non-tender ?MSK/SPINE:  No gross deformities, no edema ?SKIN:  no rash, atraumatic ? ? ?*Additional and/or pertinent findings included in MDM below ? ?Diagnostic and Interventional Summary  ? ? EKG Interpretation ? ?Date/Time:    ?Ventricular Rate:    ?PR Interval:    ?QRS Duration:   ?QT Interval:    ?QTC Calculation:   ?R Axis:     ?Text Interpretation:   ?  ? ?  ? ?Labs Reviewed  ?COMPREHENSIVE METABOLIC PANEL - Abnormal; Notable for the following components:  ?    Result Value  ? Glucose, Bld 152 (*)   ? All other components within normal limits  ?CBC - Abnormal; Notable for the following components:  ? WBC 10.6 (*)   ? All other components within normal limits  ?LIPASE, BLOOD  ?  ?No orders to display  ?  ?Medications  ?sodium chloride 0.9 % bolus  1,000 mL (1,000 mLs Intravenous New Bag/Given 07/16/21 0045)  ?ondansetron [facility]) injection 4 mg (4 mg Intravenous Given 07/16/21 0045)  ?  ? ?Procedures  /  Critical Care ?Procedures ? ?ED Course and Medical Decision Making  ?Initial Impression and Ddx ?Nausea vomiting diarrhea, suspect gastroenteritis, viral etiology.  Abdomen is soft and nontender with no rebound guarding or rigidity, vital signs largely reassuring, some mild tachycardia.  Suspect some dehydration, providing IV fluids and anticipating discharge thereafter. ? ?Past medical/surgical history that increases complexity of ED encounter: History of appendectomy ? ?Interpretation of Diagnostics ?I personally reviewed the laboratory assessment and my interpretation is as follows: No significant blood count or electrolyte  disturbance, no AKI ?   ? ? ?Patient Reassessment and Ultimate Disposition/Management ?Patient feeling better after fluids, discharged home. ? ?Patient management required discussion with the following services or consulting groups:  None ? ?Complexity of Problems Addressed ?Acute illness or injury that poses threat of life of bodily function ? ?Additional Data Reviewed and Analyzed ?Further history obtained from: ?Further history from spouse/family member ? ?Additional Factors Impacting ED Encounter Risk ?Prescriptions ? ?[name]. Sedonia Small, MD ?[facility] ?[place] ?[email or web address] ? ?Final Clinical Impressions(s) / ED Diagnoses  ? ?  ICD-10-CM   ?1. Nausea vomiting and diarrhea  R11.2   ? R19.7   ?  ?  ?ED Discharge Orders   ? ?      Ordered  ?  ondansetron (ZOFRAN-ODT) 4 MG disintegrating tablet  Every 8 hours PRN       ? 07/16/21 0200  ? ?  ?  ? ?  ?  ? ?Discharge Instructions Discussed with and Provided to Patient:  ? ? ? ?Discharge Instructions   ? ?  ?You were evaluated in the Emergency Department and after careful evaluation, we did not find any emergent condition requiring admission or further testing in the hospital. ? ?Your exam/testing today is overall reassuring.  Symptoms likely due to a viral GI illness.  Use the Zofran medication as needed for nausea, drink plenty fluids, get plenty of rest. ? ?Please return to the Emergency Department if you experience any worsening of your condition.   Thank you for allowing [place] to be a part of your care. ? ? ? ? ?  ?Maudie Flakes, MD ?07/16/21 0201 ? ?
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
